# Patient Record
Sex: Female | Born: 1985 | Race: Black or African American | Hispanic: No | Marital: Single | Smoking: Current every day smoker
Health system: Southern US, Community
[De-identification: ages and names within clinical notes are randomized; demographics above are authoritative.]

## PROBLEM LIST (undated history)

## (undated) DIAGNOSIS — I10 Essential (primary) hypertension: Secondary | ICD-10-CM

---
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
## 2005-12-06 ENCOUNTER — Emergency Department: Payer: Self-pay | Admitting: Emergency Medicine

## 2006-10-16 ENCOUNTER — Emergency Department: Payer: Self-pay | Admitting: Emergency Medicine

## 2006-10-17 ENCOUNTER — Emergency Department: Payer: Self-pay | Admitting: Emergency Medicine

## 2007-07-13 ENCOUNTER — Emergency Department: Payer: Self-pay | Admitting: Emergency Medicine

## 2007-07-20 ENCOUNTER — Ambulatory Visit: Payer: Self-pay | Admitting: Family Medicine

## 2007-11-14 ENCOUNTER — Ambulatory Visit: Payer: Self-pay | Admitting: Certified Nurse Midwife

## 2007-11-24 ENCOUNTER — Inpatient Hospital Stay: Payer: Self-pay

## 2007-11-28 ENCOUNTER — Emergency Department: Payer: Self-pay | Admitting: Emergency Medicine
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
## 2010-07-30 ENCOUNTER — Emergency Department: Payer: Self-pay | Admitting: Emergency Medicine

## 2012-01-23 ENCOUNTER — Emergency Department: Payer: Self-pay | Admitting: Emergency Medicine

## 2012-01-23 LAB — MONONUCLEOSIS SCREEN: Mono Test: NEGATIVE

## 2012-01-26 LAB — BETA STREP CULTURE(ARMC)

## 2012-07-23 ENCOUNTER — Emergency Department: Payer: Self-pay | Admitting: Emergency Medicine

## 2013-04-14 ENCOUNTER — Emergency Department: Payer: Self-pay | Admitting: Emergency Medicine

## 2013-04-14 LAB — BASIC METABOLIC PANEL
Anion Gap: 3 — ABNORMAL LOW (ref 7–16)
BUN: 9 mg/dL (ref 7–18)
Calcium, Total: 9.4 mg/dL (ref 8.5–10.1)
Chloride: 103 mmol/L (ref 98–107)
Co2: 30 mmol/L (ref 21–32)
Creatinine: 0.81 mg/dL (ref 0.60–1.30)
EGFR (African American): >60
EGFR (Non-African Amer.): >60
Glucose: 79 mg/dL (ref 65–99)
Osmolality: 270 (ref 275–301)
Potassium: 3.9 mmol/L (ref 3.5–5.1)
Sodium: 136 mmol/L (ref 136–145)

## 2013-04-14 LAB — CBC WITH DIFFERENTIAL/PLATELET
Basophil #: 0.1 10*3/uL (ref 0.0–0.1)
Basophil %: 0.8 %
Eosinophil #: 0.1 10*3/uL (ref 0.0–0.7)
Eosinophil %: 0.9 %
HCT: 42.6 % (ref 35.0–47.0)
HGB: 14.2 g/dL (ref 12.0–16.0)
Lymphocyte #: 2.5 10*3/uL (ref 1.0–3.6)
Lymphocyte %: 33.3 %
MCH: 31.1 pg (ref 26.0–34.0)
MCHC: 33.4 g/dL (ref 32.0–36.0)
MCV: 93 fL (ref 80–100)
Monocyte #: 0.8 x10 3/mm (ref 0.2–0.9)
Monocyte %: 10.2 %
Neutrophil #: 4.1 10*3/uL (ref 1.4–6.5)
Neutrophil %: 54.8 %
Platelet: 293 10*3/uL (ref 150–440)
RBC: 4.57 10*6/uL (ref 3.80–5.20)
RDW: 13.8 % (ref 11.5–14.5)
WBC: 7.6 10*3/uL (ref 3.6–11.0)

## 2013-04-14 LAB — T4, FREE: Free Thyroxine: 1.3 ng/dL (ref 0.76–1.46)

## 2013-04-14 LAB — TSH: Thyroid Stimulating Horm: 1.26 u[IU]/mL

## 2015-05-15 ENCOUNTER — Encounter (HOSPITAL_COMMUNITY): Payer: Self-pay | Admitting: *Deleted

## 2015-05-15 ENCOUNTER — Emergency Department (HOSPITAL_COMMUNITY)
Admission: EM | Admit: 2015-05-15 | Discharge: 2015-05-15 | Disposition: A | Discharge status: Home / Self Care | Payer: Medicaid Other | Attending: Emergency Medicine | Admitting: Emergency Medicine

## 2015-05-15 DIAGNOSIS — Y9389 Activity, other specified: Secondary | ICD-10-CM | POA: Diagnosis not present

## 2015-05-15 DIAGNOSIS — F17200 Nicotine dependence, unspecified, uncomplicated: Secondary | ICD-10-CM | POA: Diagnosis not present

## 2015-05-15 DIAGNOSIS — S0591XA Unspecified injury of right eye and orbit, initial encounter: Secondary | ICD-10-CM | POA: Diagnosis not present

## 2015-05-15 DIAGNOSIS — Y9289 Other specified places as the place of occurrence of the external cause: Secondary | ICD-10-CM | POA: Diagnosis not present

## 2015-05-15 DIAGNOSIS — Y998 Other external cause status: Secondary | ICD-10-CM | POA: Diagnosis not present

## 2015-05-15 DIAGNOSIS — H5711 Ocular pain, right eye: Secondary | ICD-10-CM

## 2015-05-15 MED ORDER — TETRACAINE HCL 0.5 % OP SOLN
2.0000 [drp] | Freq: Once | OPHTHALMIC | Status: AC
  Administered 2015-05-15: 2 [drp] via OPHTHALMIC
  Filled 2015-05-15: qty 2

## 2015-05-15 MED ORDER — FLUORESCEIN SODIUM 1 MG OP STRP
1.0000 | ORAL_STRIP | Freq: Once | OPHTHALMIC | Status: AC
  Administered 2015-05-15: 1 via OPHTHALMIC
  Filled 2015-05-15: qty 1

## 2015-05-15 MED ORDER — CYCLOPENTOLATE HCL 1 % OP SOLN
1.0000 [drp] | Freq: Once | OPHTHALMIC | Status: AC
  Administered 2015-05-15: 1 [drp] via OPHTHALMIC
  Filled 2015-05-15: qty 2

## 2015-05-15 NOTE — Discharge Instructions (Signed)
Eye Contusion An eye contusion is a deep bruise of the eye. This is often called a "black eye." Contusions are the result of an injury that caused bleeding under the skin. The contusion may turn blue, purple, or yellow. Minor injuries will give you a painless contusion, but more severe contusions may stay painful and swollen for a few weeks. If the eye contusion only involves the eyelids and tissues around the eye, the injured area will get better within a few days to weeks. However, eye contusions can be serious and affect the eyeball and sight. CAUSES   Blunt injury or trauma to the face or eye area.  A forehead injury that causes the blood under the skin to work its way down to the eyelids.  Rubbing the eyes due to irritation. SYMPTOMS   Swelling and redness around the eye.  Bruising around the eye.  Tenderness, soreness, or pain around the eye.  Blurry vision.  Tearing.  Eyeball redness. DIAGNOSIS  A diagnosis is usually based on a thorough exam of the eye and surrounding area. The eye must be looked at carefully to make sure it is not injured and to make sure nothing else will threaten your vision. A vision test may be done. An X-ray or computed tomography (CT) scan may be needed to determine if there are any associated injuries, such as broken bones (fractures). TREATMENT  If there is an injury to the eye, treatment will be determined by the nature of the injury. HOME CARE INSTRUCTIONS   Put ice on the injured area.  Put ice in a plastic bag.  Place a towel between your skin and the bag.  Leave the ice on for 15-20 minutes, 03-04 times a day.  If it is determined that there is no injury to the eye, you may continue normal activities.  Sunglasses may be worn to protect your eyes from bright light if light is uncomfortable.  Sleep with your head elevated. You can put an extra pillow under your head. This may help with discomfort.  Only take over-the-counter or  prescription medicines for pain, discomfort, or fever as directed by your caregiver. Do not take aspirin for the first few days. This may increase bruising. SEEK IMMEDIATE MEDICAL CARE IF:   You have any form of vision loss.  You have double vision.  You feel nauseous.  You feel dizzy, sleepy, or like you will faint.  You have any fluid discharge from the eye or your nose.  You have swelling and discoloration that does not fade. MAKE SURE YOU:   Understand these instructions.  Will watch your condition.  Will get help right away if you are not doing well or get worse.   This information is not intended to replace advice given to you by your health care provider. Make sure you discuss any questions you have with your health care provider.   Document Released: 03/05/2000 Document Revised: 05/31/2011 Document Reviewed: 11/12/2014 Elsevier Interactive Patient Education 2016 Elsevier Inc.  
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
## 2015-05-15 NOTE — ED Notes (Signed)
Patient able to ambulate independently  
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
## 2015-06-18 ENCOUNTER — Emergency Department (HOSPITAL_COMMUNITY)
Admission: EM | Admit: 2015-06-18 | Discharge: 2015-06-18 | Disposition: A | Discharge status: Home / Self Care | Payer: Medicaid Other | Attending: Emergency Medicine | Admitting: Emergency Medicine

## 2015-06-18 ENCOUNTER — Encounter (HOSPITAL_COMMUNITY): Payer: Self-pay | Admitting: *Deleted

## 2015-06-18 DIAGNOSIS — S29001A Unspecified injury of muscle and tendon of front wall of thorax, initial encounter: Secondary | ICD-10-CM | POA: Diagnosis not present

## 2015-06-18 DIAGNOSIS — Y9389 Activity, other specified: Secondary | ICD-10-CM | POA: Diagnosis not present

## 2015-06-18 DIAGNOSIS — F17200 Nicotine dependence, unspecified, uncomplicated: Secondary | ICD-10-CM | POA: Insufficient documentation

## 2015-06-18 DIAGNOSIS — Y92410 Unspecified street and highway as the place of occurrence of the external cause: Secondary | ICD-10-CM | POA: Insufficient documentation

## 2015-06-18 DIAGNOSIS — N644 Mastodynia: Secondary | ICD-10-CM

## 2015-06-18 DIAGNOSIS — Y998 Other external cause status: Secondary | ICD-10-CM | POA: Diagnosis not present

## 2015-06-18 MED ORDER — IBUPROFEN 800 MG PO TABS
800.0000 mg | ORAL_TABLET | Freq: Once | ORAL | Status: AC
  Administered 2015-06-18: 800 mg via ORAL
  Filled 2015-06-18: qty 1

## 2015-06-18 MED ORDER — IBUPROFEN 600 MG PO TABS: 600.0000 mg | ORAL_TABLET | Freq: Four times a day (QID) | ORAL | Status: AC | PRN
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
## 2018-08-08 ENCOUNTER — Ambulatory Visit (HOSPITAL_COMMUNITY)
Admission: EM | Admit: 2018-08-08 | Discharge: 2018-08-08 | Disposition: A | Discharge status: Home / Self Care | Payer: Self-pay | Attending: Family Medicine | Admitting: Family Medicine

## 2018-08-08 ENCOUNTER — Other Ambulatory Visit: Payer: Self-pay

## 2018-08-08 ENCOUNTER — Encounter (HOSPITAL_COMMUNITY): Payer: Self-pay | Admitting: Emergency Medicine

## 2018-08-08 DIAGNOSIS — I10 Essential (primary) hypertension: Secondary | ICD-10-CM

## 2018-08-08 HISTORY — DX: Essential (primary) hypertension: I10

## 2018-08-08 MED ORDER — AMLODIPINE BESYLATE 10 MG PO TABS: 10.0000 mg | ORAL_TABLET | Freq: Every day | ORAL | 2 refills | Status: DC

## 2018-08-08 NOTE — ED Triage Notes (Signed)
Pt states she tried to go to work Thursday 5/14 but they had a reading of 100 on her forehead, states she gave herself a couple of days even tho she didn't feel bad, work requires her having a note to go back. Denies symptoms.

## 2018-08-09 NOTE — ED Provider Notes (Signed)
[facility]   [id number] 08/08/18 Arrival Time: 1318  ASSESSMENT & PLAN:  1. Essential hypertension     Meds ordered this encounter  Medications  . amLODipine (NORVASC) 10 MG tablet    Sig: Take 1 tablet (10 mg total) by mouth daily.    Dispense:  30 tablet    Refill:  2    Follow-up Information    [facility].   Specialty:  Urgent Care Why:  As needed. Contact information: [address] [place] [id number] [phone]         Work note provided. No evidence of infectious disease.  Reviewed expectations re: course of current medical issues. Questions answered. Outlined signs and symptoms indicating need for more acute intervention. Patient verbalized understanding. After Visit Summary given.   SUBJECTIVE:  Amy Silva is a 33 y.o. female who presents with concerns regarding increased blood pressures. She eports that she has been treated for hypertension in the past. Out of medications. Requests refill.  She reports taking medications as instructed, no medication side effects noted, no TIA's, no chest pain on exertion, no dyspnea on exertion and no swelling of ankles.  Denies symptoms of chest pain, palpations, orthopnea, nocturnal dyspnea, or LE edema.  Social History   Tobacco Use  Smoking Status Current Some Day Smoker   Also reports temp reading of 100 degrees F by work Engineer, civil (consulting)nurse. Told she cannot return unless seen. Feeling well. No fever suspected. No URI symptoms.  ROS: As per HPI. All other systems negative.    OBJECTIVE:  Vitals:   08/08/18 1346 08/08/18 1347  BP:  (!) 169/114  Pulse: 86   Resp: 16   Temp: 98.8 F (37.1 C)   SpO2: 99%     General appearance: alert; no distress Eyes: PERRLA; EOMI HENT: normocephalic; atraumatic Neck: supple Lungs: clear to auscultation bilaterally Heart: regular rate and rhythm without murmer Abdomen: soft, non-tender; bowel sounds normal  Extremities: no edema; symmetrical with no gross deformities Skin: warm and dry Psychological: alert and cooperative; normal mood and affect   No Known Allergies  Past Medical History:  Diagnosis Date  . Hypertension    Social History   Socioeconomic History  . Marital status: Single    Spouse name: Not on file  . Number of children: Not on file  . Years of education: Not on file  . Highest education level: Not on file  Occupational History  . Not on file  Social Needs  . Financial resource strain: Not on file  . Food insecurity:    Worry: Not on file    Inability: Not on file  . Transportation needs:    Medical: Not on file    Non-medical: Not on file  Tobacco Use  . Smoking status: Current Some Day Smoker  Substance and Sexual Activity  . Alcohol use: Yes  . Drug use: No  . Sexual activity: Not on file  Lifestyle  . Physical activity:    Days per week: Not on file    Minutes per session: Not on file  . Stress: Not on file  Relationships  . Social connections:    Talks on phone: Not on file    Gets together: Not on file    Attends religious service: Not on file    Active member of club or organization: Not on file    Attends meetings of clubs or organizations: Not on file    Relationship status: Not  on file  . Intimate partner violence:    Fear of current or ex partner: Not on file    Emotionally abused: Not on file    Physically abused: Not on file    Forced sexual activity: Not on file  Other Topics Concern  . Not on file  Social History Narrative  . Not on file   No family history on file. History reviewed. No pertinent surgical history.    Mardella Layman, MD 08/09/18 [phone]

## 2019-02-28 DIAGNOSIS — I10 Essential (primary) hypertension: Secondary | ICD-10-CM | POA: Diagnosis not present

## 2019-06-01 DIAGNOSIS — I10 Essential (primary) hypertension: Secondary | ICD-10-CM | POA: Diagnosis not present

## 2019-06-01 DIAGNOSIS — Z3200 Encounter for pregnancy test, result unknown: Secondary | ICD-10-CM | POA: Diagnosis not present

## 2019-06-01 DIAGNOSIS — Z3201 Encounter for pregnancy test, result positive: Secondary | ICD-10-CM | POA: Diagnosis not present

## 2019-06-26 ENCOUNTER — Other Ambulatory Visit (HOSPITAL_COMMUNITY): Payer: Self-pay | Admitting: Primary Care

## 2019-06-26 ENCOUNTER — Other Ambulatory Visit: Payer: Self-pay | Admitting: Primary Care

## 2019-06-26 DIAGNOSIS — Z3201 Encounter for pregnancy test, result positive: Secondary | ICD-10-CM

## 2019-08-17 DIAGNOSIS — Z20822 Contact with and (suspected) exposure to covid-19: Secondary | ICD-10-CM | POA: Diagnosis not present

## 2020-04-29 DIAGNOSIS — Z20822 Contact with and (suspected) exposure to covid-19: Secondary | ICD-10-CM | POA: Diagnosis not present

## 2020-05-22 DIAGNOSIS — R87610 Atypical squamous cells of undetermined significance on cytologic smear of cervix (ASC-US): Secondary | ICD-10-CM | POA: Diagnosis not present

## 2020-05-22 DIAGNOSIS — B009 Herpesviral infection, unspecified: Secondary | ICD-10-CM | POA: Diagnosis not present

## 2020-05-22 DIAGNOSIS — Z1389 Encounter for screening for other disorder: Secondary | ICD-10-CM | POA: Diagnosis not present

## 2020-05-22 DIAGNOSIS — N760 Acute vaginitis: Secondary | ICD-10-CM | POA: Diagnosis not present

## 2020-05-22 DIAGNOSIS — Z3200 Encounter for pregnancy test, result unknown: Secondary | ICD-10-CM | POA: Diagnosis not present

## 2020-05-22 DIAGNOSIS — Z3009 Encounter for other general counseling and advice on contraception: Secondary | ICD-10-CM | POA: Diagnosis not present

## 2020-07-10 DIAGNOSIS — Z1389 Encounter for screening for other disorder: Secondary | ICD-10-CM | POA: Diagnosis not present

## 2020-07-10 DIAGNOSIS — G5603 Carpal tunnel syndrome, bilateral upper limbs: Secondary | ICD-10-CM | POA: Diagnosis not present

## 2020-07-17 DIAGNOSIS — G5603 Carpal tunnel syndrome, bilateral upper limbs: Secondary | ICD-10-CM | POA: Diagnosis not present

## 2020-09-25 ENCOUNTER — Other Ambulatory Visit: Payer: Self-pay

## 2020-09-25 ENCOUNTER — Ambulatory Visit (HOSPITAL_COMMUNITY)
Admission: EM | Admit: 2020-09-25 | Discharge: 2020-09-25 | Disposition: A | Discharge status: Home / Self Care | Payer: Medicaid Other | Attending: Student | Admitting: Student

## 2020-09-25 ENCOUNTER — Encounter (HOSPITAL_COMMUNITY): Payer: Self-pay

## 2020-09-25 DIAGNOSIS — Z113 Encounter for screening for infections with a predominantly sexual mode of transmission: Secondary | ICD-10-CM | POA: Insufficient documentation

## 2020-09-25 DIAGNOSIS — N898 Other specified noninflammatory disorders of vagina: Secondary | ICD-10-CM

## 2020-09-25 LAB — POCT URINALYSIS DIPSTICK, ED / UC
Glucose, UA: NEGATIVE mg/dL
Ketones, ur: NEGATIVE mg/dL
Nitrite: NEGATIVE
Protein, ur: 30 mg/dL — AB
Specific Gravity, Urine: 1.025 (ref 1.005–1.030)
Urobilinogen, UA: 1 mg/dL (ref 0.0–1.0)
pH: 6.5 (ref 5.0–8.0)

## 2020-09-25 LAB — HIV ANTIBODY (ROUTINE TESTING W REFLEX): HIV Screen 4th Generation wRfx: NONREACTIVE

## 2020-09-25 LAB — POC URINE PREG, ED: Preg Test, Ur: NEGATIVE

## 2020-09-25 MED ORDER — METRONIDAZOLE 500 MG PO TABS: 500.0000 mg | ORAL_TABLET | Freq: Two times a day (BID) | ORAL | 0 refills | Status: AC

## 2020-09-25 MED ORDER — LIDOCAINE HCL (PF) 1 % IJ SOLN
INTRAMUSCULAR | Status: AC
  Filled 2020-09-25: qty 2

## 2020-09-25 MED ORDER — CEFTRIAXONE SODIUM 500 MG IJ SOLR
INTRAMUSCULAR | Status: AC
  Filled 2020-09-25: qty 500

## 2020-09-25 MED ORDER — DOXYCYCLINE HYCLATE 100 MG PO CAPS: 100.0000 mg | ORAL_CAPSULE | Freq: Two times a day (BID) | ORAL | 0 refills | Status: AC

## 2020-09-25 MED ORDER — CEFTRIAXONE SODIUM 500 MG IJ SOLR
500.0000 mg | Freq: Once | INTRAMUSCULAR | Status: AC
  Administered 2020-09-25: 500 mg via INTRAMUSCULAR

## 2020-09-25 NOTE — Discharge Instructions (Addendum)
-  We treated you for gonorrhea with a shot of an antibiotic called Rocephin today. -For bacterial vaginosis and trichomonas, start the antibiotic-Flagyl (metronidazole), 2 pills daily for 7 days.  You can take this with food if you have a sensitive stomach.  Avoid alcohol while taking this medication and for 2 days after as this will cause severe nausea and vomiting. -For chlamydia, Doxycycline twice daily for 7 days.  Make sure to wear sunscreen while spending time outside while on this medication as it can increase your chance of sunburn. You can take this medication with food if you have a sensitive stomach. -We're also testing you for yeast, HIV, syphilis. We'll call you if this is positive. Abstain from intercourse until negative results and you have finished treatment. -Seek additional medical attention if symptoms worsen despite treatment, like abdominal pain, vaginal symptoms, new fevers/chills, new back pain

## 2020-09-25 NOTE — ED Notes (Signed)
Amy Silva was informed of inability to get all tubes of blood as planned.  Also made aware patient is agreeable to going to her pcp and having blood drawn.
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
## 2020-09-25 NOTE — ED Triage Notes (Signed)
Pt present lower abdominal pain with vaginal discharge(yellow) and foul odor symptom started a week ago.

## 2020-09-25 NOTE — ED Provider Notes (Signed)
[facility]    CSN: [id number] Arrival date & time: 09/25/20  1455      History   Chief Complaint Chief Complaint  Patient presents with   Vaginal Discharge   Abdominal Pain    HPI Amy Silva is a 35 y.o. female presenting with vaginal discharge and lower abd pain x2 weeks, getting progressively worse, following new partner. Medical history noncontributory. Endorses malodorous thick yellow-green discharge, external vaginal itching, lower crampy abd pain x2 weeks. She does express concern for STI. Denies hematuria, dysuria, frequency, urgency, back pain, n/v/d fevers/chills, abdnormal vaginal rashes/lesions. Notes abd pain as 3/10 (not 10/10 as noted in triage).   HPI  Past Medical History:  Diagnosis Date   Hypertension     There are no problems to display for this patient.   History reviewed. No pertinent surgical history.  OB History   No obstetric history on file.      Home Medications    Prior to Admission medications   Medication Sig Start Date End Date Taking? Authorizing Provider  doxycycline (VIBRAMYCIN) 100 MG capsule Take 1 capsule (100 mg total) by mouth 2 (two) times daily for 7 days. 09/25/20 10/02/20 Yes Rhys Martini, PA-C  metroNIDAZOLE (FLAGYL) 500 MG tablet Take 1 tablet (500 mg total) by mouth 2 (two) times daily. 09/25/20  Yes Rhys Martini, PA-C  amLODipine (NORVASC) 10 MG tablet Take 1 tablet (10 mg total) by mouth daily. 08/08/18   Mardella Layman, MD  ibuprofen (ADVIL,MOTRIN) 600 MG tablet Take 1 tablet (600 mg total) by mouth every 6 (six) hours as needed. 06/18/15   Hess, Nada Boozer, PA-C    Family History History reviewed. No pertinent family history.  Social History Social History   Tobacco Use   Smoking status: Some Days    Pack years: 0.00  Substance Use Topics   Alcohol use: Yes   Drug use: No     Allergies   Patient has no known allergies.   Review of Systems Review of Systems  Constitutional:  Negative for  appetite change, chills, diaphoresis, fever and unexpected weight change.  HENT:  Negative for congestion, ear pain, sinus pressure, sinus pain, sneezing, sore throat and trouble swallowing.   Eyes:  Negative for pain and redness.  Respiratory:  Negative for cough, chest tightness and shortness of breath.   Cardiovascular:  Negative for chest pain.  Gastrointestinal:  Positive for abdominal pain. Negative for abdominal distention, anal bleeding, blood in stool, constipation, diarrhea, nausea, rectal pain and vomiting.  Genitourinary:  Positive for vaginal discharge. Negative for decreased urine volume, difficulty urinating, dysuria, flank pain, frequency, genital sores, hematuria and urgency.  Musculoskeletal:  Negative for back pain and myalgias.  Skin:  Negative for rash.  Neurological:  Negative for dizziness, light-headedness and headaches.  All other systems reviewed and are negative.   Physical Exam Triage Vital Signs ED Triage Vitals  Enc Vitals Group     BP 09/25/20 1520 (!) 147/106     Pulse Rate 09/25/20 1520 83     Resp 09/25/20 1520 16     Temp 09/25/20 1520 99.5 F (37.5 C)     Temp Source 09/25/20 1520 Oral     SpO2 09/25/20 1520 100 %     Weight --      Height --      Head Circumference --      Peak Flow --      Pain Score 09/25/20 1522 10  Pain Loc --      Pain Edu? --      Excl. in GC? --    No data found.  Updated Vital Signs BP (!) 147/106 (BP Location: Right Arm)   Pulse 83   Temp 99.5 F (37.5 C) (Oral)   Resp 16   LMP 09/18/2020   SpO2 100%   Visual Acuity Right Eye Distance:   Left Eye Distance:   Bilateral Distance:    Right Eye Near:   Left Eye Near:    Bilateral Near:     Physical Exam Vitals reviewed.  Constitutional:      General: She is not in acute distress.    Appearance: Normal appearance. She is not ill-appearing.  HENT:     Head: Normocephalic and atraumatic.     Mouth/Throat:     Mouth: Mucous membranes are moist.      Comments: Moist mucous membranes Eyes:     Extraocular Movements: Extraocular movements intact.     Pupils: Pupils are equal, round, and reactive to light.  Cardiovascular:     Rate and Rhythm: Normal rate and regular rhythm.     Heart sounds: Normal heart sounds.  Pulmonary:     Effort: Pulmonary effort is normal.     Breath sounds: Normal breath sounds. No wheezing, rhonchi or rales.  Abdominal:     General: Bowel sounds are normal. There is no distension.     Palpations: Abdomen is soft. There is no mass.     Tenderness: There is abdominal tenderness in the right lower quadrant and left lower quadrant. There is no right CVA tenderness, left CVA tenderness, guarding or rebound.     Comments: 3/10 RLQ and LLQ pain to deep palpation. Patient comfortable throughout exam.   Genitourinary:    Comments: Deferred  Skin:    General: Skin is warm.     Capillary Refill: Capillary refill takes less than 2 seconds.     Comments: Good skin turgor  Neurological:     General: No focal deficit present.     Mental Status: She is alert and oriented to person, place, and time.  Psychiatric:        Mood and Affect: Mood normal.        Behavior: Behavior normal.     UC Treatments / Results  Labs (all labs ordered are listed, but only abnormal results are displayed) Labs Reviewed  POCT URINALYSIS DIPSTICK, ED / UC - Abnormal; Notable for the following components:      Result Value   Bilirubin Urine SMALL (*)    Hgb urine dipstick LARGE (*)    Protein, ur 30 (*)    Leukocytes,Ua SMALL (*)    All other components within normal limits  URINE CULTURE  RPR  HIV ANTIBODY (ROUTINE TESTING W REFLEX)  POC URINE PREG, ED  CERVICOVAGINAL ANCILLARY ONLY    EKG   Radiology No results found.  Procedures Procedures (including critical care time)  Medications Ordered in UC Medications  cefTRIAXone (ROCEPHIN) injection 500 mg (has no administration in time range)    Initial Impression /  Assessment and Plan / UC Course  I have reviewed the triage vital signs and the nursing notes.  Pertinent labs & imaging results that were available during my care of the patient were reviewed by me and considered in my medical decision making (see chart for details).     This patient is a very pleasant 35 y.o. year old female presenting with  suspected STI. Afebrile and nontachycardic. Mild abd pain to deep palpation.   Urine pregnancy negative  UA fairly normal, will send culture. Self swab sent for BV, yeast, gonorrhea, chlamydia, trich. Also sent for HIV, syphilis.   Given new onset of abd pain, I do have concern for early PID. Will treat with Rocephin, doxy, flagyl as below. Safe sex precautions.  Strict ED return precautions discussed. Patient verbalizes understanding and agreement.   Coding this visit a Level 4 for acute illness with systemic symptoms and prescription drug management.   Final Clinical Impressions(s) / UC Diagnoses   Final diagnoses:  Vaginal discharge  Routine screening for STI (sexually transmitted infection)     Discharge Instructions      -We treated you for gonorrhea with a shot of an antibiotic called Rocephin today. -For bacterial vaginosis and trichomonas, start the antibiotic-Flagyl (metronidazole), 2 pills daily for 7 days.  You can take this with food if you have a sensitive stomach.  Avoid alcohol while taking this medication and for 2 days after as this will cause severe nausea and vomiting. -For chlamydia, Doxycycline twice daily for 7 days.  Make sure to wear sunscreen while spending time outside while on this medication as it can increase your chance of sunburn. You can take this medication with food if you have a sensitive stomach. -We're also testing you for yeast, HIV, syphilis. We'll call you if this is positive. Abstain from intercourse until negative results and you have finished treatment. -Seek additional medical attention if symptoms  worsen despite treatment, like abdominal pain, vaginal symptoms, new fevers/chills, new back pain      ED Prescriptions     Medication Sig Dispense Auth. Provider   doxycycline (VIBRAMYCIN) 100 MG capsule Take 1 capsule (100 mg total) by mouth 2 (two) times daily for 7 days. 14 capsule Rhys Martini, PA-C   metroNIDAZOLE (FLAGYL) 500 MG tablet Take 1 tablet (500 mg total) by mouth 2 (two) times daily. 14 tablet Rhys Martini, PA-C      PDMP not reviewed this encounter.   Rhys Martini, PA-C 09/25/20 1626

## 2020-09-26 LAB — CERVICOVAGINAL ANCILLARY ONLY
Bacterial Vaginitis (gardnerella): POSITIVE — AB
Candida Glabrata: NEGATIVE
Candida Vaginitis: NEGATIVE
Chlamydia: NEGATIVE
Comment: NEGATIVE
Comment: NEGATIVE
Comment: NEGATIVE
Comment: NEGATIVE
Comment: NEGATIVE
Comment: NORMAL
Neisseria Gonorrhea: POSITIVE — AB
Trichomonas: NEGATIVE

## 2020-09-26 LAB — RPR: RPR Ser Ql: NONREACTIVE

## 2020-09-27 LAB — URINE CULTURE

## 2021-02-19 DIAGNOSIS — Z419 Encounter for procedure for purposes other than remedying health state, unspecified: Secondary | ICD-10-CM | POA: Diagnosis not present

## 2021-03-22 DIAGNOSIS — Z419 Encounter for procedure for purposes other than remedying health state, unspecified: Secondary | ICD-10-CM | POA: Diagnosis not present

## 2021-04-22 DIAGNOSIS — Z419 Encounter for procedure for purposes other than remedying health state, unspecified: Secondary | ICD-10-CM | POA: Diagnosis not present

## 2021-05-20 DIAGNOSIS — Z419 Encounter for procedure for purposes other than remedying health state, unspecified: Secondary | ICD-10-CM | POA: Diagnosis not present

## 2021-06-20 DIAGNOSIS — Z419 Encounter for procedure for purposes other than remedying health state, unspecified: Secondary | ICD-10-CM | POA: Diagnosis not present

## 2021-07-20 DIAGNOSIS — Z419 Encounter for procedure for purposes other than remedying health state, unspecified: Secondary | ICD-10-CM | POA: Diagnosis not present

## 2021-08-20 DIAGNOSIS — Z419 Encounter for procedure for purposes other than remedying health state, unspecified: Secondary | ICD-10-CM | POA: Diagnosis not present

## 2021-09-19 DIAGNOSIS — Z419 Encounter for procedure for purposes other than remedying health state, unspecified: Secondary | ICD-10-CM | POA: Diagnosis not present

## 2021-10-20 DIAGNOSIS — Z419 Encounter for procedure for purposes other than remedying health state, unspecified: Secondary | ICD-10-CM | POA: Diagnosis not present

## 2021-11-20 DIAGNOSIS — Z419 Encounter for procedure for purposes other than remedying health state, unspecified: Secondary | ICD-10-CM | POA: Diagnosis not present

## 2021-11-24 ENCOUNTER — Ambulatory Visit: Payer: Medicaid Other | Admitting: Family

## 2021-12-11 ENCOUNTER — Telehealth: Payer: Self-pay

## 2021-12-11 NOTE — Telephone Encounter (Signed)
Contacted pt regarding establishing a PCP. Pt is on Managed Medicaid. Pt refused stating she was overwhelmed with family issues. Advised pt to call back to number on caller ID when is is able. If pt calls back please send to Judson Roch or [employer].

## 2021-12-18 ENCOUNTER — Telehealth: Payer: Self-pay

## 2021-12-18 NOTE — Telephone Encounter (Signed)
Patient called to connect with a Primary Care Provider. Patient has Managed Medicaid. LVM for patient to call [phone] to discuss making an appoint with a Primary Care Provider. If patient returns call, please reach out to Desirie Minteer or Leslie, RN.   

## 2021-12-20 DIAGNOSIS — Z419 Encounter for procedure for purposes other than remedying health state, unspecified: Secondary | ICD-10-CM | POA: Diagnosis not present

## 2021-12-29 DIAGNOSIS — O99011 Anemia complicating pregnancy, first trimester: Secondary | ICD-10-CM | POA: Diagnosis not present

## 2021-12-29 DIAGNOSIS — I10 Essential (primary) hypertension: Secondary | ICD-10-CM | POA: Diagnosis not present

## 2021-12-29 DIAGNOSIS — O99019 Anemia complicating pregnancy, unspecified trimester: Secondary | ICD-10-CM | POA: Diagnosis not present

## 2021-12-29 DIAGNOSIS — Z113 Encounter for screening for infections with a predominantly sexual mode of transmission: Secondary | ICD-10-CM | POA: Diagnosis not present

## 2021-12-29 DIAGNOSIS — Z3402 Encounter for supervision of normal first pregnancy, second trimester: Secondary | ICD-10-CM | POA: Diagnosis not present

## 2021-12-29 DIAGNOSIS — B3731 Acute candidiasis of vulva and vagina: Secondary | ICD-10-CM | POA: Diagnosis not present

## 2022-01-05 ENCOUNTER — Telehealth: Payer: Self-pay

## 2022-01-05 NOTE — Telephone Encounter (Signed)
Patient called to connect with a Primary Care Provider. Patient has Managed Medicaid. LVM for patient to call [phone] to discuss making an appoint with a Primary Care Provider. If patient returns call, please reach out to Demeisha Geraghty or Leslie, RN.   

## 2022-01-13 DIAGNOSIS — Z3402 Encounter for supervision of normal first pregnancy, second trimester: Secondary | ICD-10-CM | POA: Diagnosis not present

## 2022-01-20 DIAGNOSIS — Z419 Encounter for procedure for purposes other than remedying health state, unspecified: Secondary | ICD-10-CM | POA: Diagnosis not present

## 2022-01-25 DIAGNOSIS — O0932 Supervision of pregnancy with insufficient antenatal care, second trimester: Secondary | ICD-10-CM | POA: Diagnosis not present

## 2022-01-25 DIAGNOSIS — R109 Unspecified abdominal pain: Secondary | ICD-10-CM | POA: Diagnosis not present

## 2022-01-25 DIAGNOSIS — O26892 Other specified pregnancy related conditions, second trimester: Secondary | ICD-10-CM | POA: Diagnosis not present

## 2022-01-25 DIAGNOSIS — Z3A25 25 weeks gestation of pregnancy: Secondary | ICD-10-CM | POA: Diagnosis not present

## 2022-01-25 DIAGNOSIS — O09522 Supervision of elderly multigravida, second trimester: Secondary | ICD-10-CM | POA: Diagnosis not present

## 2022-01-26 DIAGNOSIS — O10912 Unspecified pre-existing hypertension complicating pregnancy, second trimester: Secondary | ICD-10-CM | POA: Diagnosis not present

## 2022-01-26 DIAGNOSIS — Z363 Encounter for antenatal screening for malformations: Secondary | ICD-10-CM | POA: Diagnosis not present

## 2022-01-26 DIAGNOSIS — O99212 Obesity complicating pregnancy, second trimester: Secondary | ICD-10-CM | POA: Diagnosis not present

## 2022-01-26 DIAGNOSIS — O4442 Low lying placenta NOS or without hemorrhage, second trimester: Secondary | ICD-10-CM | POA: Diagnosis not present

## 2022-01-26 DIAGNOSIS — Z3A25 25 weeks gestation of pregnancy: Secondary | ICD-10-CM | POA: Diagnosis not present

## 2022-01-26 DIAGNOSIS — O0932 Supervision of pregnancy with insufficient antenatal care, second trimester: Secondary | ICD-10-CM | POA: Diagnosis not present

## 2022-02-16 DIAGNOSIS — Z3689 Encounter for other specified antenatal screening: Secondary | ICD-10-CM | POA: Diagnosis not present

## 2022-02-16 DIAGNOSIS — O10013 Pre-existing essential hypertension complicating pregnancy, third trimester: Secondary | ICD-10-CM | POA: Diagnosis not present

## 2022-02-16 DIAGNOSIS — Z3A28 28 weeks gestation of pregnancy: Secondary | ICD-10-CM | POA: Diagnosis not present

## 2022-02-18 DIAGNOSIS — O1213 Gestational proteinuria, third trimester: Secondary | ICD-10-CM | POA: Diagnosis not present

## 2022-02-18 DIAGNOSIS — O10013 Pre-existing essential hypertension complicating pregnancy, third trimester: Secondary | ICD-10-CM | POA: Diagnosis not present

## 2022-02-18 DIAGNOSIS — Z3A00 Weeks of gestation of pregnancy not specified: Secondary | ICD-10-CM | POA: Diagnosis not present

## 2022-02-19 DIAGNOSIS — Z419 Encounter for procedure for purposes other than remedying health state, unspecified: Secondary | ICD-10-CM | POA: Diagnosis not present

## 2022-02-22 DIAGNOSIS — O10013 Pre-existing essential hypertension complicating pregnancy, third trimester: Secondary | ICD-10-CM | POA: Diagnosis not present

## 2022-02-22 DIAGNOSIS — Z79899 Other long term (current) drug therapy: Secondary | ICD-10-CM | POA: Diagnosis not present

## 2022-02-22 DIAGNOSIS — Z3A29 29 weeks gestation of pregnancy: Secondary | ICD-10-CM | POA: Diagnosis not present

## 2022-02-22 DIAGNOSIS — O403XX0 Polyhydramnios, third trimester, not applicable or unspecified: Secondary | ICD-10-CM | POA: Diagnosis not present

## 2022-02-22 DIAGNOSIS — O09523 Supervision of elderly multigravida, third trimester: Secondary | ICD-10-CM | POA: Diagnosis not present

## 2022-02-22 DIAGNOSIS — O10913 Unspecified pre-existing hypertension complicating pregnancy, third trimester: Secondary | ICD-10-CM | POA: Diagnosis not present

## 2022-02-25 DIAGNOSIS — Z23 Encounter for immunization: Secondary | ICD-10-CM | POA: Diagnosis not present

## 2022-03-04 DIAGNOSIS — O10913 Unspecified pre-existing hypertension complicating pregnancy, third trimester: Secondary | ICD-10-CM | POA: Diagnosis not present

## 2022-03-04 DIAGNOSIS — O09523 Supervision of elderly multigravida, third trimester: Secondary | ICD-10-CM | POA: Diagnosis not present

## 2022-03-04 DIAGNOSIS — Z3A31 31 weeks gestation of pregnancy: Secondary | ICD-10-CM | POA: Diagnosis not present

## 2022-03-04 DIAGNOSIS — O113 Pre-existing hypertension with pre-eclampsia, third trimester: Secondary | ICD-10-CM | POA: Diagnosis not present

## 2022-03-22 DIAGNOSIS — Z419 Encounter for procedure for purposes other than remedying health state, unspecified: Secondary | ICD-10-CM | POA: Diagnosis not present

## 2022-03-23 DIAGNOSIS — O1213 Gestational proteinuria, third trimester: Secondary | ICD-10-CM | POA: Diagnosis not present

## 2022-03-23 DIAGNOSIS — Z3689 Encounter for other specified antenatal screening: Secondary | ICD-10-CM | POA: Diagnosis not present

## 2022-03-23 DIAGNOSIS — Z3A32 32 weeks gestation of pregnancy: Secondary | ICD-10-CM | POA: Diagnosis not present

## 2022-03-23 DIAGNOSIS — O09523 Supervision of elderly multigravida, third trimester: Secondary | ICD-10-CM | POA: Diagnosis not present

## 2022-03-23 DIAGNOSIS — O365930 Maternal care for other known or suspected poor fetal growth, third trimester, not applicable or unspecified: Secondary | ICD-10-CM | POA: Diagnosis not present

## 2022-03-23 DIAGNOSIS — O10913 Unspecified pre-existing hypertension complicating pregnancy, third trimester: Secondary | ICD-10-CM | POA: Diagnosis not present

## 2022-04-01 DIAGNOSIS — Z3A35 35 weeks gestation of pregnancy: Secondary | ICD-10-CM | POA: Diagnosis not present

## 2022-04-01 DIAGNOSIS — O113 Pre-existing hypertension with pre-eclampsia, third trimester: Secondary | ICD-10-CM | POA: Diagnosis not present

## 2022-04-01 DIAGNOSIS — O10913 Unspecified pre-existing hypertension complicating pregnancy, third trimester: Secondary | ICD-10-CM | POA: Diagnosis not present

## 2022-04-01 DIAGNOSIS — O365930 Maternal care for other known or suspected poor fetal growth, third trimester, not applicable or unspecified: Secondary | ICD-10-CM | POA: Diagnosis not present

## 2022-04-01 DIAGNOSIS — Z3689 Encounter for other specified antenatal screening: Secondary | ICD-10-CM | POA: Diagnosis not present

## 2022-04-01 DIAGNOSIS — O09523 Supervision of elderly multigravida, third trimester: Secondary | ICD-10-CM | POA: Diagnosis not present

## 2022-04-07 DIAGNOSIS — Z3A36 36 weeks gestation of pregnancy: Secondary | ICD-10-CM | POA: Diagnosis not present

## 2022-04-07 DIAGNOSIS — O10913 Unspecified pre-existing hypertension complicating pregnancy, third trimester: Secondary | ICD-10-CM | POA: Diagnosis not present

## 2022-04-07 DIAGNOSIS — O365930 Maternal care for other known or suspected poor fetal growth, third trimester, not applicable or unspecified: Secondary | ICD-10-CM | POA: Diagnosis not present

## 2022-04-07 DIAGNOSIS — O09523 Supervision of elderly multigravida, third trimester: Secondary | ICD-10-CM | POA: Diagnosis not present

## 2022-04-07 DIAGNOSIS — O1213 Gestational proteinuria, third trimester: Secondary | ICD-10-CM | POA: Diagnosis not present

## 2022-04-07 DIAGNOSIS — Z3689 Encounter for other specified antenatal screening: Secondary | ICD-10-CM | POA: Diagnosis not present

## 2022-04-14 DIAGNOSIS — O113 Pre-existing hypertension with pre-eclampsia, third trimester: Secondary | ICD-10-CM | POA: Diagnosis not present

## 2022-04-14 DIAGNOSIS — Z3A37 37 weeks gestation of pregnancy: Secondary | ICD-10-CM | POA: Diagnosis not present

## 2022-04-14 DIAGNOSIS — O09523 Supervision of elderly multigravida, third trimester: Secondary | ICD-10-CM | POA: Diagnosis not present

## 2022-04-14 DIAGNOSIS — O10013 Pre-existing essential hypertension complicating pregnancy, third trimester: Secondary | ICD-10-CM | POA: Diagnosis not present

## 2022-04-20 DIAGNOSIS — Z79899 Other long term (current) drug therapy: Secondary | ICD-10-CM | POA: Diagnosis not present

## 2022-04-20 DIAGNOSIS — A6009 Herpesviral infection of other urogenital tract: Secondary | ICD-10-CM | POA: Diagnosis not present

## 2022-04-20 DIAGNOSIS — O10913 Unspecified pre-existing hypertension complicating pregnancy, third trimester: Secondary | ICD-10-CM | POA: Diagnosis not present

## 2022-04-20 DIAGNOSIS — O9089 Other complications of the puerperium, not elsewhere classified: Secondary | ICD-10-CM | POA: Diagnosis not present

## 2022-04-20 DIAGNOSIS — Z8249 Family history of ischemic heart disease and other diseases of the circulatory system: Secondary | ICD-10-CM | POA: Diagnosis not present

## 2022-04-20 DIAGNOSIS — O411230 Chorioamnionitis, third trimester, not applicable or unspecified: Secondary | ICD-10-CM | POA: Diagnosis not present

## 2022-04-20 DIAGNOSIS — O9832 Other infections with a predominantly sexual mode of transmission complicating childbirth: Secondary | ICD-10-CM | POA: Diagnosis not present

## 2022-04-20 DIAGNOSIS — Z3A37 37 weeks gestation of pregnancy: Secondary | ICD-10-CM | POA: Diagnosis not present

## 2022-04-20 DIAGNOSIS — R1084 Generalized abdominal pain: Secondary | ICD-10-CM | POA: Diagnosis not present

## 2022-04-20 DIAGNOSIS — O1002 Pre-existing essential hypertension complicating childbirth: Secondary | ICD-10-CM | POA: Diagnosis not present

## 2022-04-20 DIAGNOSIS — O98313 Other infections with a predominantly sexual mode of transmission complicating pregnancy, third trimester: Secondary | ICD-10-CM | POA: Diagnosis not present

## 2022-04-20 DIAGNOSIS — R109 Unspecified abdominal pain: Secondary | ICD-10-CM | POA: Diagnosis not present

## 2022-04-20 DIAGNOSIS — A6004 Herpesviral vulvovaginitis: Secondary | ICD-10-CM | POA: Diagnosis not present

## 2022-04-21 DIAGNOSIS — O10913 Unspecified pre-existing hypertension complicating pregnancy, third trimester: Secondary | ICD-10-CM | POA: Diagnosis not present

## 2022-04-21 DIAGNOSIS — Z3A38 38 weeks gestation of pregnancy: Secondary | ICD-10-CM | POA: Diagnosis not present

## 2022-04-22 DIAGNOSIS — Z419 Encounter for procedure for purposes other than remedying health state, unspecified: Secondary | ICD-10-CM | POA: Diagnosis not present

## 2022-04-23 DIAGNOSIS — O163 Unspecified maternal hypertension, third trimester: Secondary | ICD-10-CM | POA: Diagnosis not present

## 2022-04-23 DIAGNOSIS — R1084 Generalized abdominal pain: Secondary | ICD-10-CM | POA: Diagnosis not present

## 2022-04-23 DIAGNOSIS — O411230 Chorioamnionitis, third trimester, not applicable or unspecified: Secondary | ICD-10-CM | POA: Diagnosis not present

## 2022-04-23 DIAGNOSIS — Z3A38 38 weeks gestation of pregnancy: Secondary | ICD-10-CM | POA: Diagnosis not present

## 2022-04-30 DIAGNOSIS — Z3483 Encounter for supervision of other normal pregnancy, third trimester: Secondary | ICD-10-CM | POA: Diagnosis not present

## 2022-04-30 DIAGNOSIS — Z3482 Encounter for supervision of other normal pregnancy, second trimester: Secondary | ICD-10-CM | POA: Diagnosis not present

## 2022-05-21 DIAGNOSIS — Z419 Encounter for procedure for purposes other than remedying health state, unspecified: Secondary | ICD-10-CM | POA: Diagnosis not present

## 2022-07-06 ENCOUNTER — Telehealth: Payer: Self-pay | Admitting: *Deleted

## 2022-07-06 NOTE — Telephone Encounter (Signed)
Attempted to contact patient to schedule apt with PCP for chronic condition follow up. LVM to call back. AS, CMA  

## 2023-01-22 ENCOUNTER — Ambulatory Visit
Admission: EM | Admit: 2023-01-22 | Discharge: 2023-01-22 | Disposition: A | Discharge status: Home / Self Care | Payer: Medicaid Other

## 2023-01-22 ENCOUNTER — Emergency Department (HOSPITAL_COMMUNITY)
Admission: EM | Admit: 2023-01-22 | Discharge: 2023-01-22 | Disposition: A | Discharge status: Home / Self Care | Payer: Medicaid Other | Attending: Emergency Medicine | Admitting: Emergency Medicine

## 2023-01-22 ENCOUNTER — Encounter: Payer: Self-pay | Admitting: *Deleted

## 2023-01-22 ENCOUNTER — Other Ambulatory Visit: Payer: Self-pay

## 2023-01-22 ENCOUNTER — Encounter (HOSPITAL_COMMUNITY): Payer: Self-pay

## 2023-01-22 DIAGNOSIS — I160 Hypertensive urgency: Secondary | ICD-10-CM

## 2023-01-22 DIAGNOSIS — Z79899 Other long term (current) drug therapy: Secondary | ICD-10-CM | POA: Insufficient documentation

## 2023-01-22 DIAGNOSIS — K0889 Other specified disorders of teeth and supporting structures: Secondary | ICD-10-CM | POA: Insufficient documentation

## 2023-01-22 DIAGNOSIS — R519 Headache, unspecified: Secondary | ICD-10-CM | POA: Insufficient documentation

## 2023-01-22 DIAGNOSIS — I10 Essential (primary) hypertension: Secondary | ICD-10-CM | POA: Diagnosis not present

## 2023-01-22 LAB — CBC
HCT: 45 % (ref 36.0–46.0)
Hemoglobin: 15.4 g/dL — ABNORMAL HIGH (ref 12.0–15.0)
MCH: 31.5 pg (ref 26.0–34.0)
MCHC: 34.2 g/dL (ref 30.0–36.0)
MCV: 92 fL (ref 80.0–100.0)
Platelets: 305 10*3/uL (ref 150–400)
RBC: 4.89 MIL/uL (ref 3.87–5.11)
RDW: 13.7 % (ref 11.5–15.5)
WBC: 7.4 10*3/uL (ref 4.0–10.5)
nRBC: 0 % (ref 0.0–0.2)

## 2023-01-22 LAB — HCG, SERUM, QUALITATIVE: Preg, Serum: NEGATIVE

## 2023-01-22 MED ORDER — AMLODIPINE BESYLATE 10 MG PO TABS: 10.0000 mg | ORAL_TABLET | Freq: Every day | ORAL | 0 refills | Status: DC

## 2023-01-22 MED ORDER — KETOROLAC TROMETHAMINE 15 MG/ML IJ SOLN
15.0000 mg | Freq: Once | INTRAMUSCULAR | Status: AC
  Administered 2023-01-22: 15 mg via INTRAVENOUS
  Filled 2023-01-22: qty 1

## 2023-01-22 MED ORDER — AMLODIPINE BESYLATE 10 MG PO TABS: 10.0000 mg | ORAL_TABLET | Freq: Every day | ORAL | 0 refills | Status: AC

## 2023-01-22 MED ORDER — ONDANSETRON HCL 4 MG/2ML IJ SOLN
4.0000 mg | Freq: Once | INTRAMUSCULAR | Status: AC
Start: 2023-01-22 — End: 2023-01-22
  Administered 2023-01-22: 4 mg via INTRAVENOUS
  Filled 2023-01-22: qty 2

## 2023-01-22 MED ORDER — NAPROXEN 500 MG PO TABS: 500.0000 mg | ORAL_TABLET | Freq: Two times a day (BID) | ORAL | 0 refills | Status: AC

## 2023-01-22 MED ORDER — SODIUM CHLORIDE 0.9 % IV BOLUS
1000.0000 mL | Freq: Once | INTRAVENOUS | Status: AC
Start: 2023-01-22 — End: 2023-01-22
  Administered 2023-01-22: 1000 mL via INTRAVENOUS

## 2023-01-22 MED ORDER — DIPHENHYDRAMINE HCL 50 MG/ML IJ SOLN
12.5000 mg | Freq: Once | INTRAMUSCULAR | Status: AC
  Administered 2023-01-22: 12.5 mg via INTRAVENOUS
  Filled 2023-01-22: qty 1

## 2023-01-22 MED ORDER — CLINDAMYCIN HCL 150 MG PO CAPS: 450.0000 mg | ORAL_CAPSULE | Freq: Three times a day (TID) | ORAL | 0 refills | Status: DC

## 2023-01-22 MED ORDER — MORPHINE SULFATE (PF) 4 MG/ML IV SOLN
4.0000 mg | Freq: Once | INTRAVENOUS | Status: AC
  Administered 2023-01-22: 4 mg via INTRAMUSCULAR
  Filled 2023-01-22: qty 1

## 2023-01-22 MED ORDER — NAPROXEN 500 MG PO TABS: 500.0000 mg | ORAL_TABLET | Freq: Two times a day (BID) | ORAL | 0 refills | Status: DC

## 2023-01-22 MED ORDER — CLINDAMYCIN HCL 150 MG PO CAPS: 450.0000 mg | ORAL_CAPSULE | Freq: Three times a day (TID) | ORAL | 0 refills | Status: AC

## 2023-01-22 MED ORDER — CLINDAMYCIN HCL 300 MG PO CAPS
450.0000 mg | ORAL_CAPSULE | Freq: Once | ORAL | Status: AC
  Administered 2023-01-22: 450 mg via ORAL
  Filled 2023-01-22: qty 1

## 2023-01-22 MED ORDER — AMLODIPINE BESYLATE 5 MG PO TABS
10.0000 mg | ORAL_TABLET | Freq: Once | ORAL | Status: AC
  Administered 2023-01-22: 10 mg via ORAL
  Filled 2023-01-22: qty 2

## 2023-01-22 NOTE — ED Triage Notes (Signed)
Patient said her left lower tooth has been hurting for 2 days. She said it is infected. Has not taken blood pressure medication for 1 month.

## 2023-01-22 NOTE — ED Notes (Signed)
Patient is being discharged from the Urgent Care and sent to the Emergency Department via pov . Per Ervin Knack, NP, patient is in need of higher level of care due to Elevated BP. Patient is aware and verbalizes understanding of plan of care.  Vitals:   01/22/23 0954 01/22/23 0956  BP: (!) 209/149 (!) 193/137  Pulse: 72   Resp: (!) 22   Temp: 98.8 F (37.1 C)   SpO2: 99%

## 2023-01-22 NOTE — ED Notes (Signed)
Warm pack provided to patient at this time per patient request.

## 2023-01-22 NOTE — ED Notes (Signed)
Patient pacing in room. This nurse reassessed pain. Patient states pain is still 10/10. Edp notified.

## 2023-01-22 NOTE — ED Notes (Signed)
BMP recollect sent to lab at this time

## 2023-01-22 NOTE — Discharge Instructions (Addendum)
Thank you for the opportunity to take care of you in our Emergency Department. You have been diagnosed with high blood pressure, also known as hypertension. This means that the force of blood against the walls of your blood vessels called is too strong. It also means that your heart has to work harder to move the blood. High blood pressure usually has no symptoms, but over time, it can cause serious health problems such as Heart attack and heart failure Stroke Kidney disease and failure Vision loss With the help from your healthcare provider and some important life style changes, you can manage your blood pressure and protect your health.  Take clindamycin 3 times a day for 7 days to prevent infection.  I have attached dental resource that you can use to find a local dentist.  Please read the instructions provided on hypertension, how to manage it and how to check your blood pressure. Additionally, use the blood pressure log provided to record your blood pressures. Take the blood pressure log with you to your primary care doctor so that they can adjust your blood pressure medications if needed. Please read the instructions on follow-up appointment. Return to the ER or Call 911 right away if you have any of these symptoms: Chest pain or shortness of breath Severe headache Weakness, tingling, or numbness of your face, arms, or legs (especially on 1 side of the body) Sudden change in vision Confusion, trouble speaking, or trouble understanding speech

## 2023-01-22 NOTE — ED Notes (Addendum)
Unable to get patient labs she stated " she was in so much pain".

## 2023-01-22 NOTE — ED Notes (Signed)
Edp ordered ice for pain. Patient refuses this intervention stating it will make it hurt worse

## 2023-01-22 NOTE — ED Provider Notes (Signed)
[place] [facility] Provider Note   CSN: [id number] Arrival date & time: 01/22/23  1138     History  Chief Complaint  Patient presents with   Dental Pain    Nakiah Osgood is a 37 y.o. female with a history of hypertension who presents the ED today for multiple concerns.  Patient states that she has had pain in the lower left tooth for the past 2 days.  She has tried using Orajel without any relief.  She went to urgent care earlier today for evaluation of her dental pain and her blood pressure was 209/149.  Additionally, patient reported headache at that time so she was advised to come here for possible hypertensive urgency.  The headache is located at the left side of her head and has been present since the onset of dental pain. She was not given anything for blood pressure or pain prior to arrival.  Denies chest pain, shortness of breath, vision changes, weakness, or fevers.  No other complaints or concerns at this time.    Home Medications Prior to Admission medications   Medication Sig Start Date End Date Taking? Authorizing Provider  amLODipine (NORVASC) 10 MG tablet Take 1 tablet (10 mg total) by mouth daily. 01/22/23  Yes Maxwell Marion, PA-C  clindamycin (CLEOCIN) 150 MG capsule Take 3 capsules (450 mg total) by mouth 3 (three) times daily for 7 days. 01/22/23 01/29/23 Yes Maxwell Marion, PA-C  naproxen (NAPROSYN) 500 MG tablet Take 1 tablet (500 mg total) by mouth 2 (two) times daily for 14 days. 01/22/23 02/05/23 Yes Maxwell Marion, PA-C  ibuprofen (ADVIL,MOTRIN) 600 MG tablet Take 1 tablet (600 mg total) by mouth every 6 (six) hours as needed. 06/18/15   Hess, Nada Boozer, PA-C  metroNIDAZOLE (FLAGYL) 500 MG tablet Take 1 tablet (500 mg total) by mouth 2 (two) times daily. 09/25/20   Rhys Martini, PA-C      Allergies    Patient has no known allergies.    Review of Systems   Review of Systems  HENT:  Positive for dental problem.   Neurological:   Positive for headaches.  All other systems reviewed and are negative.   Physical Exam Updated Vital Signs BP (!) 176/116   Pulse 69   Temp 98.5 F (36.9 C) (Oral)   Resp (!) 22   Ht 4\' 9"  (1.448 m)   Wt 63.5 kg   LMP 12/21/2022 (Approximate)   SpO2 99%   BMI 30.30 kg/m  Physical Exam Vitals and nursing note reviewed.  Constitutional:      General: She is not in acute distress.    Appearance: Normal appearance.  HENT:     Head: Normocephalic and atraumatic.     Mouth/Throat:     Mouth: Mucous membranes are moist.  Eyes:     Conjunctiva/sclera: Conjunctivae normal.     Pupils: Pupils are equal, round, and reactive to light.  Cardiovascular:     Rate and Rhythm: Normal rate and regular rhythm.     Pulses: Normal pulses.     Heart sounds: Normal heart sounds.  Pulmonary:     Effort: Pulmonary effort is normal.     Breath sounds: Normal breath sounds.  Abdominal:     Palpations: Abdomen is soft.     Tenderness: There is no abdominal tenderness.  Musculoskeletal:        General: Normal range of motion.     Cervical back: No rigidity or tenderness.  Skin:  General: Skin is warm and dry.     Findings: No rash.  Neurological:     General: No focal deficit present.     Mental Status: She is alert.     Sensory: No sensory deficit.     Motor: No weakness.  Psychiatric:        Mood and Affect: Mood normal.        Behavior: Behavior normal.    ED Results / Procedures / Treatments   Labs (all labs ordered are listed, but only abnormal results are displayed) Labs Reviewed  CBC - Abnormal; Notable for the following components:      Result Value   Hemoglobin 15.4 (*)    All other components within normal limits  HCG, SERUM, QUALITATIVE  BASIC METABOLIC PANEL    EKG None  Radiology No results found.  Procedures Procedures: not indicated.   Medications Ordered in ED Medications  morphine (PF) 4 MG/ML injection 4 mg (4 mg Intramuscular Given 01/22/23 1303)   amLODipine (NORVASC) tablet 10 mg (10 mg Oral Given 01/22/23 1306)  ketorolac (TORADOL) 15 MG/ML injection 15 mg (15 mg Intravenous Given 01/22/23 1255)  ondansetron (ZOFRAN) injection 4 mg (4 mg Intravenous Given 01/22/23 1255)  diphenhydrAMINE (BENADRYL) injection 12.5 mg (12.5 mg Intravenous Given 01/22/23 1256)  sodium chloride 0.9 % bolus 1,000 mL (0 mLs Intravenous Stopped 01/22/23 1348)  clindamycin (CLEOCIN) capsule 450 mg (450 mg Oral Given 01/22/23 1520)    ED Course/ Medical Decision Making/ A&P                                 Medical Decision Making Amount and/or Complexity of Data Reviewed Labs: ordered.  Risk Prescription drug management.   This patient presents to the ED for concern of dental pain and headache, this involves an extensive number of treatment options, and is a complaint that carries with it a high risk of complications and morbidity.   Differential diagnosis includes: Pulpitis, gingivitis, dental fracture, dental abscess, atypical headache, history headache, tension headache, migraine, elevated blood pressure second to pain, hypertensive urgency, etc.  Comorbidities  See HPI above   Additional History  Additional history obtained from prior urgent care note.   Lab Tests  I ordered and personally interpreted labs.  The pertinent results include:   CBC within normal limits CMP was drawn twice and hemolyzed both times, patient is feeling better and does not wish to be stuck again   Problem List / ED Course / Critical Interventions / Medication Management  Dental pain I ordered medications including: Clindamycin for dental infection Toradol, Benadryl, Zofran, and normal saline for headache Morphine for pain Home amlodipine given for hypertension Reevaluation of the patient after these medicines showed that the patient improved I have reviewed the patients home medicines and have made adjustments as needed   Social Determinants of  Health  Access to healthcare   Test / Admission - Considered  Discussed findings with patient.  All questions were answered. She is hemodynamically stable and safe for discharge home. Return precautions provided.       Final Clinical Impression(s) / ED Diagnoses Final diagnoses:  Uncontrolled hypertension  Pain, dental    Rx / DC Orders ED Discharge Orders          Ordered    clindamycin (CLEOCIN) 150 MG capsule  3 times daily        01/22/23 1424  naproxen (NAPROSYN) 500 MG tablet  2 times daily        01/22/23 1526    amLODipine (NORVASC) 10 MG tablet  Daily        01/22/23 1527              Maxwell Marion, PA-C 01/22/23 1531    Derwood Kaplan, MD 01/23/23 1048

## 2023-01-22 NOTE — ED Notes (Signed)
Lab called stating bmp hemolyzed, needs recollect.

## 2023-01-22 NOTE — ED Notes (Signed)
When attempting to recollect BMP from IV site, patient continuously moving, unable to remain still. IV pulled out of hand. Bleeding noted. Pressure applied and bleeding stopped.

## 2023-01-22 NOTE — ED Triage Notes (Addendum)
Pt reports left lower tooth pain x 2 days. States tooth has a hole in it. Also reports headache. States "I haven't been able to get my blood pressure pills"

## 2023-01-22 NOTE — ED Provider Notes (Signed)
[facility]    CSN: [id number] Arrival date & time: 01/22/23  0854      History   Chief Complaint Chief Complaint  Patient presents with   Dental Pain    HPI Amy Silva is a 37 y.o. female.   Patient originally presented for dental pain that has been present for quite some time but worsened over the past 24 to 48 hours.  Denies any injury to the mouth.  She has taken ibuprofen for pain.  Denies any fever.  I was called to physical exam room by nursing staff given patient's blood pressure was significantly elevated.  Patient reports that she is supposed to be taking amlodipine but has been out of it for approximately 1 month.  She reports that she does have a headache and her pain is currently "100"/10 on pain scale.  Denies dizziness, blurred vision, nausea, vomiting, chest pain, shortness of breath.   Dental Pain   Past Medical History:  Diagnosis Date   Hypertension     There are no problems to display for this patient.   Past Surgical History:  Procedure Laterality Date   CESAREAN SECTION      OB History   No obstetric history on file.      Home Medications    Prior to Admission medications   Medication Sig Start Date End Date Taking? Authorizing Provider  amLODipine (NORVASC) 10 MG tablet Take 1 tablet (10 mg total) by mouth daily. 08/08/18   Mardella Layman, MD  ibuprofen (ADVIL,MOTRIN) 600 MG tablet Take 1 tablet (600 mg total) by mouth every 6 (six) hours as needed. 06/18/15   Hess, Nada Boozer, PA-C  metroNIDAZOLE (FLAGYL) 500 MG tablet Take 1 tablet (500 mg total) by mouth 2 (two) times daily. 09/25/20   Rhys Martini, PA-C    Family History History reviewed. No pertinent family history.  Social History Social History   Tobacco Use   Smoking status: Every Day    Types: Cigarettes  Vaping Use   Vaping status: Never Used  Substance Use Topics   Alcohol use: Yes   Drug use: No     Allergies   Patient has no known  allergies.   Review of Systems Review of Systems Per HPI  Physical Exam Triage Vital Signs ED Triage Vitals  Encounter Vitals Group     BP 01/22/23 0954 (!) 209/149     Systolic BP Percentile --      Diastolic BP Percentile --      Pulse Rate 01/22/23 0954 72     Resp 01/22/23 0954 (!) 22     Temp 01/22/23 0954 98.8 F (37.1 C)     Temp Source 01/22/23 0954 Oral     SpO2 01/22/23 0954 99 %     Weight --      Height --      Head Circumference --      Peak Flow --      Pain Score 01/22/23 0950 10     Pain Loc --      Pain Education --      Exclude from Growth Chart --    No data found.  Updated Vital Signs BP (!) 193/137 (BP Location: Left Arm)   Pulse 72   Temp 98.8 F (37.1 C) (Oral)   Resp (!) 22   LMP 12/21/2022 (Approximate)   SpO2 99%   Visual Acuity Right Eye Distance:   Left Eye Distance:   Bilateral Distance:  Right Eye Near:   Left Eye Near:    Bilateral Near:     Physical Exam Constitutional:      General: She is not in acute distress.    Appearance: Normal appearance. She is not toxic-appearing or diaphoretic.     Comments: Patient appears to not be able to sit still due to pain  HENT:     Head: Normocephalic and atraumatic.     Mouth/Throat:      Comments: Patient does have chipped tooth at left lower back dentition with surrounding mild gingival erythema and swelling. Eyes:     Extraocular Movements: Extraocular movements intact.     Conjunctiva/sclera: Conjunctivae normal.     Pupils: Pupils are equal, round, and reactive to light.  Cardiovascular:     Rate and Rhythm: Normal rate and regular rhythm.     Pulses: Normal pulses.     Heart sounds: Normal heart sounds.  Pulmonary:     Effort: Pulmonary effort is normal. No respiratory distress.     Breath sounds: Normal breath sounds.  Neurological:     General: No focal deficit present.     Mental Status: She is alert and oriented to person, place, and time. Mental status is at  baseline.     Cranial Nerves: Cranial nerves 2-12 are intact.     Sensory: Sensation is intact.     Motor: Motor function is intact.     Coordination: Coordination is intact.     Gait: Gait is intact.  Psychiatric:        Mood and Affect: Mood normal.        Behavior: Behavior normal.        Thought Content: Thought content normal.        Judgment: Judgment normal.      UC Treatments / Results  Labs (all labs ordered are listed, but only abnormal results are displayed) Labs Reviewed - No data to display  EKG   Radiology No results found.  Procedures Procedures (including critical care time)  Medications Ordered in UC Medications - No data to display  Initial Impression / Assessment and Plan / UC Course  I have reviewed the triage vital signs and the nursing notes.  Pertinent labs & imaging results that were available during my care of the patient were reviewed by me and considered in my medical decision making (see chart for details).     Patient does have dental pain and concern for infection but patient's blood pressure is also significantly elevated.  With associated severe headache, I am concerned for hypertensive urgency.  Therefore, patient was advised to go to the emergency department for further evaluation and management.  She was agreeable with this plan.  Patient wished to self transport.  Will defer management of dental pain and infection to ER as well given priority of blood pressure.  Patient left via self transport to go to the emergency department. Final Clinical Impressions(s) / UC Diagnoses   Final diagnoses:  Pain, dental  Hypertensive urgency   Discharge Instructions   None    ED Prescriptions   None    PDMP not reviewed this encounter.   Gustavus Bryant, [place] 01/22/23 1015

## 2024-03-18 ENCOUNTER — Emergency Department (HOSPITAL_COMMUNITY): Payer: Self-pay

## 2024-03-18 ENCOUNTER — Other Ambulatory Visit: Payer: Self-pay

## 2024-03-18 ENCOUNTER — Encounter (HOSPITAL_COMMUNITY): Payer: Self-pay

## 2024-03-18 ENCOUNTER — Emergency Department (HOSPITAL_COMMUNITY)
Admission: EM | Admit: 2024-03-18 | Discharge: 2024-03-18 | Discharge status: Against Medical Advice | Payer: Self-pay | Attending: Emergency Medicine | Admitting: Emergency Medicine

## 2024-03-18 DIAGNOSIS — Z5321 Procedure and treatment not carried out due to patient leaving prior to being seen by health care provider: Secondary | ICD-10-CM | POA: Insufficient documentation

## 2024-03-18 DIAGNOSIS — J101 Influenza due to other identified influenza virus with other respiratory manifestations: Secondary | ICD-10-CM | POA: Insufficient documentation

## 2024-03-18 LAB — TROPONIN T, HIGH SENSITIVITY
Troponin T High Sensitivity: <15 ng/L (ref 0–19)
Troponin T High Sensitivity: <15 ng/L (ref 0–19)

## 2024-03-18 LAB — BASIC METABOLIC PANEL WITH GFR
Anion gap: 14 (ref 5–15)
BUN: 10 mg/dL (ref 6–20)
CO2: 23 mmol/L (ref 22–32)
Calcium: 9.6 mg/dL (ref 8.9–10.3)
Chloride: 100 mmol/L (ref 98–111)
Creatinine, Ser: 0.75 mg/dL (ref 0.44–1.00)
GFR, Estimated: >60 mL/min
Glucose, Bld: 109 mg/dL — ABNORMAL HIGH (ref 70–99)
Potassium: 4 mmol/L (ref 3.5–5.1)
Sodium: 136 mmol/L (ref 135–145)

## 2024-03-18 LAB — CBC
HCT: 42.4 % (ref 36.0–46.0)
Hemoglobin: 14.4 g/dL (ref 12.0–15.0)
MCH: 31 pg (ref 26.0–34.0)
MCHC: 34 g/dL (ref 30.0–36.0)
MCV: 91.2 fL (ref 80.0–100.0)
Platelets: 285 K/uL (ref 150–400)
RBC: 4.65 MIL/uL (ref 3.87–5.11)
RDW: 13.2 % (ref 11.5–15.5)
WBC: 8.3 K/uL (ref 4.0–10.5)
nRBC: 0 % (ref 0.0–0.2)

## 2024-03-18 LAB — RESP PANEL BY RT-PCR (RSV, FLU A&B, COVID)  RVPGX2
Influenza A by PCR: POSITIVE — AB
Influenza B by PCR: NEGATIVE
Resp Syncytial Virus by PCR: NEGATIVE
SARS Coronavirus 2 by RT PCR: NEGATIVE

## 2024-03-18 LAB — HCG, SERUM, QUALITATIVE: Preg, Serum: NEGATIVE

## 2024-03-18 NOTE — ED Provider Triage Note (Signed)
 Emergency Medicine Provider Triage Evaluation Note  Amy Silva , a 38 y.o. female  was evaluated in triage.  Pt complains of flulike symptoms, chest pain.  Symptoms been ongoing for 2 days, child is sick with similar symptoms.  Describes pleuritic chest pain, fatigue, weakness.   Review of Systems  Positive: As above Negative: As above  Physical Exam  BP (!) 179/99   Pulse 71   Temp 98.1 F (36.7 C)   Resp 18   SpO2 100%  Gen:   Awake, no distress   Resp:  Normal effort  MSK:   Moves extremities without difficulty  Other:    Medical Decision Making  Medically screening exam initiated at 2:25 PM.  Appropriate orders placed.  Amy Silva was informed that the remainder of the evaluation will be completed by another provider, this initial triage assessment does not replace that evaluation, and the importance of remaining in the ED until their evaluation is complete.     Amy Silva, [place] 03/18/24 1426

## 2024-03-18 NOTE — ED Triage Notes (Addendum)
 Pt c.o flu like symptoms x 2 days. Pt also c.o chest pain

## 2024-03-18 NOTE — ED Notes (Signed)
 Add to prior note.... Pt was brought back to triage to get repeat trop.  Pt would not present arm for Amy Silva, EMT explained procedure to pt.  When Amy went to draw labs again pt just set there and would not present arm. Amy advised she had other pts to attended to. Pt them walked out of triage and said nothing.

## 2024-03-18 NOTE — ED Notes (Signed)
 Pt called for vitals and did not respond. Pt marked as eloped.

## 2024-03-18 NOTE — ED Notes (Signed)
 Pt walked out of triage in no acute distress.
